# Patient Record
Sex: Male | Born: 1975 | Race: White | Hispanic: No | Marital: Single | Smoking: Current every day smoker
Health system: Southern US, Community
[De-identification: ages and names within clinical notes are randomized; demographics above are authoritative.]

## PROBLEM LIST (undated history)

## (undated) DIAGNOSIS — I10 Essential (primary) hypertension: Secondary | ICD-10-CM

## (undated) DIAGNOSIS — L509 Urticaria, unspecified: Secondary | ICD-10-CM

## (undated) HISTORY — DX: Essential (primary) hypertension: I10

## (undated) HISTORY — DX: Urticaria, unspecified: L50.9

---

## 2004-05-11 ENCOUNTER — Emergency Department (HOSPITAL_COMMUNITY): Admission: EM | Admit: 2004-05-11 | Discharge: 2004-05-11 | Payer: Self-pay | Admitting: Family Medicine

## 2005-01-05 ENCOUNTER — Emergency Department (HOSPITAL_COMMUNITY): Admission: EM | Admit: 2005-01-05 | Discharge: 2005-01-05 | Payer: Self-pay | Admitting: Family Medicine

## 2005-01-11 ENCOUNTER — Ambulatory Visit (HOSPITAL_COMMUNITY): Admission: RE | Admit: 2005-01-11 | Discharge: 2005-01-11 | Payer: Self-pay | Admitting: Emergency Medicine

## 2005-03-08 ENCOUNTER — Emergency Department (HOSPITAL_COMMUNITY): Admission: EM | Admit: 2005-03-08 | Discharge: 2005-03-08 | Payer: Self-pay | Admitting: Family Medicine

## 2006-06-25 ENCOUNTER — Emergency Department (HOSPITAL_COMMUNITY): Admission: EM | Admit: 2006-06-25 | Discharge: 2006-06-26 | Payer: Self-pay | Admitting: Emergency Medicine

## 2011-11-21 ENCOUNTER — Emergency Department (INDEPENDENT_AMBULATORY_CARE_PROVIDER_SITE_OTHER)
Admission: EM | Admit: 2011-11-21 | Discharge: 2011-11-21 | Disposition: A | Discharge status: Home / Self Care | Payer: Self-pay | Source: Home / Self Care | Attending: Family Medicine | Admitting: Family Medicine

## 2011-11-21 ENCOUNTER — Encounter (HOSPITAL_COMMUNITY): Payer: Self-pay | Admitting: Emergency Medicine

## 2011-11-21 DIAGNOSIS — H6690 Otitis media, unspecified, unspecified ear: Secondary | ICD-10-CM

## 2011-11-21 DIAGNOSIS — J329 Chronic sinusitis, unspecified: Secondary | ICD-10-CM

## 2011-11-21 MED ORDER — AMOXICILLIN 500 MG PO CAPS: 500.0000 mg | ORAL_CAPSULE | Freq: Three times a day (TID) | ORAL | Status: DC

## 2011-11-21 MED ORDER — CETIRIZINE HCL 10 MG PO CHEW: 10.0000 mg | CHEWABLE_TABLET | Freq: Every day | ORAL | Status: AC

## 2011-11-21 MED ORDER — SALINE NASAL SPRAY 0.65 % NA SOLN: 1.0000 | NASAL | Status: DC | PRN

## 2011-11-21 NOTE — ED Notes (Signed)
Pt c/o poss sinus infection since Sunday... Sx include: nasal congestion, cough w/yellow sputum, fevers, nose bleeds and also c/o left ear pain since yesterday... Denies: vomiting, nauseas, diarrhea, sore throat.

## 2011-11-21 NOTE — ED Provider Notes (Signed)
History     CSN: [id number]  Arrival date & time 11/21/11  1016   First MD Initiated Contact with Patient 11/21/11 1056      Chief Complaint  Patient presents with  . Sinusitis    (Consider location/radiation/quality/duration/timing/severity/associated sxs/prior treatment) Patient is a 36 y.o. male presenting with sinusitis. The history is provided by the patient.  Sinusitis  This is a new problem. The current episode started 2 days ago. The problem has been gradually worsening. There has been no fever. The pain is mild. The pain has been constant since onset. Associated symptoms include chills, congestion, ear pain and sinus pressure. Pertinent negatives include no sweats, no hoarse voice, no sore throat, no swollen glands, no cough and no shortness of breath. Associated symptoms comments: Nose bleeding. Treatments tried: dayquil and nyquil. The treatment provided mild relief.    History reviewed. No pertinent past medical history.  History reviewed. No pertinent past surgical history.  No family history on file.  History  Substance Use Topics  . Smoking status: Current Every Day Smoker -- 1.5 packs/day    Types: Cigarettes  . Smokeless tobacco: Not on file  . Alcohol Use: Yes      Review of Systems  Constitutional: Positive for chills.  HENT: Positive for ear pain, nosebleeds, congestion and sinus pressure. Negative for sore throat and hoarse voice.   Respiratory: Negative for cough and shortness of breath.   All other systems reviewed and are negative.    Allergies  Review of patient's allergies indicates no known allergies.  Home Medications   Current Outpatient Rx  Name  Route  Sig  Dispense  Refill  . AMOXICILLIN 500 MG PO CAPS   Oral   Take 1 capsule (500 mg total) by mouth 3 (three) times daily.   21 capsule   0   . CETIRIZINE HCL 10 MG PO CHEW   Oral   Chew 1 tablet (10 mg total) by mouth daily.   30 tablet   0   . SALINE NASAL SPRAY 0.65 % NA  SOLN   Nasal   Place 1 spray into the nose as needed for congestion.   30 mL   12     BP 147/93  Pulse 72  Temp 98.6 F (37 C) (Oral)  Resp 18  SpO2 100%  Physical Exam  Nursing note and vitals reviewed. Constitutional: He is oriented to person, place, and time. Vital signs are normal. He appears well-developed and well-nourished. He is active and cooperative.  HENT:  Head: Normocephalic.  Right Ear: External ear normal. Tympanic membrane is injected and bulging.  Left Ear: External ear normal. Tympanic membrane is erythematous and bulging.  Nose: Mucosal edema present. Right sinus exhibits maxillary sinus tenderness. Right sinus exhibits no frontal sinus tenderness. Left sinus exhibits maxillary sinus tenderness. Left sinus exhibits no frontal sinus tenderness.  Mouth/Throat: Uvula is midline and mucous membranes are normal. Posterior oropharyngeal erythema present. No posterior oropharyngeal edema.       Post nasal gtt noted  Eyes: Conjunctivae normal and EOM are normal. Pupils are equal, round, and reactive to light. No scleral icterus.  Neck: Trachea normal, normal range of motion and full passive range of motion without pain. Neck supple.  Cardiovascular: Normal rate, regular rhythm, normal heart sounds, intact distal pulses and normal pulses.   Pulmonary/Chest: Effort normal and breath sounds normal.  Lymphadenopathy:       Head (right side): Tonsillar adenopathy present. No submental, no submandibular, no  preauricular, no posterior auricular and no occipital adenopathy present.       Head (left side): Tonsillar and posterior auricular adenopathy present.  Neurological: He is alert and oriented to person, place, and time. No cranial nerve deficit or sensory deficit.  Skin: Skin is warm and dry. No rash noted.  Psychiatric: He has a normal mood and affect. His speech is normal and behavior is normal. Judgment and thought content normal. Cognition and memory are normal.    ED  Course  Procedures (including critical care time)  Labs Reviewed - No data to display No results found.   1. Sinusitis   2. Otitis media       MDM  Increase fluid intake, rest.  Begin Amoxicillin.  Begin expectorant/decongestant, topical decongestant, saline nasal spray and/or saline irrigation, and cough suppressant at bedtime. Antihistamines of your choice (Claritin or Zyrtec).  Tylenol or Motrin for fever/discomfort.  Followup with PCP if not improving 7 to 10 days.       Johnsie Kindred, NP 11/21/11 1110

## 2011-11-21 NOTE — ED Provider Notes (Signed)
Medical screening examination/treatment/procedure(s) were performed by resident physician or non-physician practitioner and as supervising physician I was immediately available for consultation/collaboration.   KINDL,JAMES DOUGLAS MD.    James D Kindl, MD 11/21/11 2135 

## 2012-03-01 ENCOUNTER — Emergency Department (HOSPITAL_COMMUNITY)
Admission: EM | Admit: 2012-03-01 | Discharge: 2012-03-01 | Disposition: A | Discharge status: Home / Self Care | Payer: Self-pay | Source: Home / Self Care | Attending: Family Medicine | Admitting: Family Medicine

## 2012-03-01 ENCOUNTER — Encounter (HOSPITAL_COMMUNITY): Payer: Self-pay

## 2012-03-01 DIAGNOSIS — J020 Streptococcal pharyngitis: Secondary | ICD-10-CM

## 2012-03-01 LAB — POCT RAPID STREP A: Streptococcus, Group A Screen (Direct): POSITIVE — AB

## 2012-03-01 MED ORDER — IBUPROFEN 800 MG PO TABS
ORAL_TABLET | ORAL | Status: AC
  Filled 2012-03-01: qty 1

## 2012-03-01 MED ORDER — IBUPROFEN 800 MG PO TABS
800.0000 mg | ORAL_TABLET | Freq: Once | ORAL | Status: AC
  Administered 2012-03-01: 800 mg via ORAL

## 2012-03-01 MED ORDER — PENICILLIN G BENZATHINE 1200000 UNIT/2ML IM SUSP
1.2000 10*6.[IU] | Freq: Once | INTRAMUSCULAR | Status: AC
  Administered 2012-03-01: 1.2 10*6.[IU] via INTRAMUSCULAR

## 2012-03-01 MED ORDER — PENICILLIN G BENZATHINE 1200000 UNIT/2ML IM SUSP
INTRAMUSCULAR | Status: AC
  Filled 2012-03-01: qty 2

## 2012-03-01 NOTE — ED Provider Notes (Signed)
History     CSN: [id number]  Arrival date & time 03/01/12  1041   First MD Initiated Contact with Patient 03/01/12 1318      Chief Complaint  Patient presents with  . Sore Throat    (Consider location/radiation/quality/duration/timing/severity/associated sxs/prior treatment) HPI Comments: Denies strep contact  Patient is a 37 y.o. male presenting with pharyngitis. The history is provided by the patient.  Sore Throat This is a new problem. The current episode started more than 2 days ago. The problem occurs constantly. The problem has not changed since onset.The symptoms are aggravated by swallowing. Nothing relieves the symptoms. He has tried nothing for the symptoms.    History reviewed. No pertinent past medical history.  History reviewed. No pertinent past surgical history.  History reviewed. No pertinent family history.  History  Substance Use Topics  . Smoking status: Current Every Day Smoker -- 1.50 packs/day    Types: Cigarettes  . Smokeless tobacco: Not on file  . Alcohol Use: Yes      Review of Systems  Constitutional: Positive for fever and chills.  HENT: Positive for sore throat. Negative for congestion and rhinorrhea.   Respiratory: Negative for cough.   Skin: Positive for rash. Negative for pallor.    Allergies  Review of patient's allergies indicates no known allergies.  Home Medications   Current Outpatient Rx  Name  Route  Sig  Dispense  Refill  . amoxicillin (AMOXIL) 500 MG capsule   Oral   Take 1 capsule (500 mg total) by mouth 3 (three) times daily.   21 capsule   0   . cetirizine (ZYRTEC) 10 MG chewable tablet   Oral   Chew 1 tablet (10 mg total) by mouth daily.   30 tablet   0   . sodium chloride (OCEAN NASAL SPRAY) 0.65 % nasal spray   Nasal   Place 1 spray into the nose as needed for congestion.   30 mL   12     BP 162/86  Pulse 122  Temp(Src) 102.7 F (39.3 C) (Oral)  Resp 16  SpO2 99%  Physical Exam   Constitutional: He appears well-developed and well-nourished. He appears ill. No distress.  HENT:  Right Ear: Tympanic membrane, external ear and ear canal normal.  Left Ear: Tympanic membrane, external ear and ear canal normal.  Mouth/Throat: Mucous membranes are normal. Oropharyngeal exudate, posterior oropharyngeal edema and posterior oropharyngeal erythema present.  Cardiovascular: Regular rhythm.  Tachycardia present.   Pulmonary/Chest: Effort normal and breath sounds normal.    ED Course  Procedures (including critical care time)  Labs Reviewed  POCT RAPID STREP A ([facility] ONLY) - Abnormal; Notable for the following:    Streptococcus, Group A Screen (Direct) POSITIVE (*)    All other components within normal limits   No results found.   1. Strep pharyngitis       MDM          Cathlyn Parsons, NP 03/01/12 1322

## 2012-03-01 NOTE — ED Notes (Signed)
3rd day of HA, body aches, ST; tonsils swollen, exudative, red

## 2012-03-01 NOTE — ED Provider Notes (Signed)
Medical screening examination/treatment/procedure(s) were performed by resident physician or non-physician practitioner and as supervising physician I was immediately available for consultation/collaboration.   KINDL,JAMES DOUGLAS MD.   James D Kindl, MD 03/01/12 1459 

## 2012-04-01 ENCOUNTER — Emergency Department (HOSPITAL_COMMUNITY)
Admission: EM | Admit: 2012-04-01 | Discharge: 2012-04-01 | Disposition: A | Discharge status: Home / Self Care | Payer: Self-pay | Source: Home / Self Care | Attending: Emergency Medicine | Admitting: Emergency Medicine

## 2012-04-01 ENCOUNTER — Emergency Department (INDEPENDENT_AMBULATORY_CARE_PROVIDER_SITE_OTHER): Payer: Self-pay

## 2012-04-01 ENCOUNTER — Encounter (HOSPITAL_COMMUNITY): Payer: Self-pay | Admitting: Emergency Medicine

## 2012-04-01 DIAGNOSIS — J40 Bronchitis, not specified as acute or chronic: Secondary | ICD-10-CM

## 2012-04-01 LAB — POCT RAPID STREP A: Streptococcus, Group A Screen (Direct): NEGATIVE

## 2012-04-01 MED ORDER — AZITHROMYCIN 250 MG PO TABS: 250.0000 mg | ORAL_TABLET | Freq: Every day | ORAL | Status: DC

## 2012-04-01 MED ORDER — ALBUTEROL SULFATE HFA 108 (90 BASE) MCG/ACT IN AERS: 1.0000 | INHALATION_SPRAY | Freq: Four times a day (QID) | RESPIRATORY_TRACT | Status: DC | PRN

## 2012-04-01 NOTE — ED Provider Notes (Signed)
History     CSN: [id number]  Arrival date & time 04/01/12  1205   None     Chief Complaint  Patient presents with  . URI  . Abdominal Pain    (Consider location/radiation/quality/duration/timing/severity/associated sxs/prior treatment) Patient is a 37 y.o. male presenting with URI and abdominal pain. The history is provided by the patient. No language interpreter was used.  URI Presenting symptoms: congestion, cough and rhinorrhea   Severity:  Moderate Onset quality:  Gradual Timing:  Constant Progression:  Worsening Chronicity:  Chronic Worsened by:  Nothing tried Ineffective treatments:  None tried Risk factors: no sick contacts   Abdominal Pain Associated symptoms: cough   Pt complains of a cough for the past 2 months.  Pt reports he has had sinus drainage,  Pt reports coughing for 2 months.   Pt reports upper abdomen sore from coughing  History reviewed. No pertinent past medical history.  History reviewed. No pertinent past surgical history.  History reviewed. No pertinent family history.  History  Substance Use Topics  . Smoking status: Current Every Day Smoker -- 1.50 packs/day    Types: Cigarettes  . Smokeless tobacco: Not on file  . Alcohol Use: Yes      Review of Systems  HENT: Positive for congestion and rhinorrhea.   Respiratory: Positive for cough.   Gastrointestinal: Positive for abdominal pain.  All other systems reviewed and are negative.    Allergies  Review of patient's allergies indicates no known allergies.  Home Medications   Current Outpatient Rx  Name  Route  Sig  Dispense  Refill  . amoxicillin (AMOXIL) 500 MG capsule   Oral   Take 1 capsule (500 mg total) by mouth 3 (three) times daily.   21 capsule   0   . cetirizine (ZYRTEC) 10 MG chewable tablet   Oral   Chew 1 tablet (10 mg total) by mouth daily.   30 tablet   0   . sodium chloride (OCEAN NASAL SPRAY) 0.65 % nasal spray   Nasal   Place 1 spray into the nose as  needed for congestion.   30 mL   12     BP 145/91  Pulse 90  Temp(Src) 98.1 F (36.7 C) (Oral)  SpO2 99%  Physical Exam  Nursing note and vitals reviewed. Constitutional: He appears well-developed and well-nourished.  HENT:  Head: Normocephalic and atraumatic.  Right Ear: External ear normal.  Left Ear: External ear normal.  Eyes: Conjunctivae are normal. Pupils are equal, round, and reactive to light.  Neck: Normal range of motion. Neck supple.  Cardiovascular: Normal rate.   Pulmonary/Chest: Effort normal. He exhibits tenderness.  Abdominal: Soft. Bowel sounds are normal.  Musculoskeletal: Normal range of motion.  Neurological: He is alert.  Skin: Skin is warm.    ED Course  Procedures (including critical care time)  Labs Reviewed  POCT RAPID STREP A ([facility] ONLY)   Dg Chest 2 View  04/01/2012  *RADIOLOGY REPORT*  Clinical Data: 37 year old male cough.  History of smoking.  CHEST - 2 VIEW  Comparison: None.  Findings: Curvilinear opacity in the right upper lobe appears represent scarring.  Lung volumes within normal limits.  No pneumothorax, pulmonary edema, pleural effusion or other confluent pulmonary opacity.  Cardiac size and mediastinal contours are within normal limits.  Visualized tracheal air column is within normal limits.  No acute osseous abnormality identified.  IMPRESSION: Evidence of right upper lobe scarring. No acute cardiopulmonary abnormality.  Original Report Authenticated By: Erskine Speed, M.D.      1. Bronchitis       MDM  Pt given rx for zithromax and albuterol.   Pt advised to stop smoking.        Lonia Skinner [place], PA-C 04/01/12 1516  Elson Areas, PA-C 04/01/12 1752  Lonia Skinner [place], PA-C 04/01/12 1901  Elson Areas, PA-C 04/02/12 [address] [place], [place] 04/02/12 1257

## 2012-04-01 NOTE — ED Notes (Addendum)
Pt c/o sore throat, stuffy nose, and productive cough with yellow/greenish phlegm x 1 month. Felt sinus pressure and also ear pain. Took Tylenol sinus and pressure has resolved. Still has a cough, reports had strept throat once already this yr. Also reports epigastric pain from emesis Friday night. Feels like he strained too hard and has sharp pains whenever his stomach growls. Patient is alert and oriented.

## 2012-04-03 NOTE — ED Provider Notes (Signed)
Medical screening examination/treatment/procedure(s) were performed by non-physician practitioner and as supervising physician I was immediately available for consultation/collaboration.   [facility]; MD  Sharin Grave, MD 04/03/12 [phone]

## 2013-11-20 ENCOUNTER — Emergency Department (HOSPITAL_COMMUNITY)
Admission: EM | Admit: 2013-11-20 | Discharge: 2013-11-20 | Disposition: A | Discharge status: Home / Self Care | Payer: No Typology Code available for payment source | Attending: Emergency Medicine | Admitting: Emergency Medicine

## 2013-11-20 ENCOUNTER — Emergency Department (HOSPITAL_COMMUNITY): Payer: No Typology Code available for payment source

## 2013-11-20 ENCOUNTER — Encounter (HOSPITAL_COMMUNITY): Payer: Self-pay

## 2013-11-20 DIAGNOSIS — Y92410 Unspecified street and highway as the place of occurrence of the external cause: Secondary | ICD-10-CM | POA: Insufficient documentation

## 2013-11-20 DIAGNOSIS — Z79899 Other long term (current) drug therapy: Secondary | ICD-10-CM | POA: Insufficient documentation

## 2013-11-20 DIAGNOSIS — Z720 Tobacco use: Secondary | ICD-10-CM | POA: Diagnosis not present

## 2013-11-20 DIAGNOSIS — S39012A Strain of muscle, fascia and tendon of lower back, initial encounter: Secondary | ICD-10-CM | POA: Diagnosis not present

## 2013-11-20 DIAGNOSIS — Z792 Long term (current) use of antibiotics: Secondary | ICD-10-CM | POA: Insufficient documentation

## 2013-11-20 DIAGNOSIS — Y9389 Activity, other specified: Secondary | ICD-10-CM | POA: Insufficient documentation

## 2013-11-20 DIAGNOSIS — Y998 Other external cause status: Secondary | ICD-10-CM | POA: Diagnosis not present

## 2013-11-20 DIAGNOSIS — S3992XA Unspecified injury of lower back, initial encounter: Secondary | ICD-10-CM | POA: Diagnosis present

## 2013-11-20 MED ORDER — HYDROCODONE-ACETAMINOPHEN 5-325 MG PO TABS: 1.0000 | ORAL_TABLET | Freq: Four times a day (QID) | ORAL | Status: DC | PRN

## 2013-11-20 MED ORDER — HYDROCODONE-ACETAMINOPHEN 5-325 MG PO TABS
1.0000 | ORAL_TABLET | Freq: Once | ORAL | Status: AC
  Administered 2013-11-20: 1 via ORAL
  Filled 2013-11-20: qty 1

## 2013-11-20 MED ORDER — CYCLOBENZAPRINE HCL 5 MG PO TABS: 5.0000 mg | ORAL_TABLET | Freq: Three times a day (TID) | ORAL | Status: DC | PRN

## 2013-11-20 NOTE — Discharge Instructions (Signed)

## 2013-11-20 NOTE — Progress Notes (Signed)
Orthopedic Tech Progress Note Patient Details:  Frederick Elliott 03/22/1975 [id number]  Patient ID: Frederick Elliott, male   DOB: 03/22/1975, 38 y.o.   MRN: [id number] Bio-tech brace order completed  Nikki DomCrawford, Maximiliano Cromartie 11/20/2013, 3:11 PM
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
## 2013-11-20 NOTE — ED Notes (Addendum)
Pt. Reports having soreness on his rt. Lateral  Rib area.  Pt. Reports that it is sore to touch,.  No visible marks noted.  Reported to Dr. Madilyn Hookees.
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
## 2020-05-12 DIAGNOSIS — E785 Hyperlipidemia, unspecified: Secondary | ICD-10-CM | POA: Diagnosis not present

## 2020-05-12 DIAGNOSIS — Z720 Tobacco use: Secondary | ICD-10-CM | POA: Diagnosis not present

## 2020-05-12 DIAGNOSIS — I10 Essential (primary) hypertension: Secondary | ICD-10-CM | POA: Diagnosis not present

## 2020-05-12 DIAGNOSIS — Z789 Other specified health status: Secondary | ICD-10-CM | POA: Diagnosis not present

## 2021-06-01 DIAGNOSIS — L500 Allergic urticaria: Secondary | ICD-10-CM | POA: Diagnosis not present

## 2021-06-01 DIAGNOSIS — I10 Essential (primary) hypertension: Secondary | ICD-10-CM | POA: Diagnosis not present

## 2021-06-01 DIAGNOSIS — L509 Urticaria, unspecified: Secondary | ICD-10-CM | POA: Diagnosis not present

## 2021-06-01 DIAGNOSIS — T7840XA Allergy, unspecified, initial encounter: Secondary | ICD-10-CM | POA: Diagnosis not present

## 2021-06-01 DIAGNOSIS — Z760 Encounter for issue of repeat prescription: Secondary | ICD-10-CM | POA: Diagnosis not present

## 2021-07-27 DIAGNOSIS — E785 Hyperlipidemia, unspecified: Secondary | ICD-10-CM | POA: Diagnosis not present

## 2021-07-27 DIAGNOSIS — Z1331 Encounter for screening for depression: Secondary | ICD-10-CM | POA: Diagnosis not present

## 2021-07-27 DIAGNOSIS — I10 Essential (primary) hypertension: Secondary | ICD-10-CM | POA: Diagnosis not present

## 2021-07-27 DIAGNOSIS — Z720 Tobacco use: Secondary | ICD-10-CM | POA: Diagnosis not present

## 2021-11-21 DIAGNOSIS — R1084 Generalized abdominal pain: Secondary | ICD-10-CM | POA: Diagnosis not present

## 2022-11-22 DIAGNOSIS — Z1211 Encounter for screening for malignant neoplasm of colon: Secondary | ICD-10-CM | POA: Diagnosis not present

## 2022-11-22 DIAGNOSIS — E785 Hyperlipidemia, unspecified: Secondary | ICD-10-CM | POA: Diagnosis not present

## 2022-11-22 DIAGNOSIS — F17210 Nicotine dependence, cigarettes, uncomplicated: Secondary | ICD-10-CM | POA: Diagnosis not present

## 2022-11-22 DIAGNOSIS — I10 Essential (primary) hypertension: Secondary | ICD-10-CM | POA: Diagnosis not present

## 2023-09-20 ENCOUNTER — Encounter: Payer: Self-pay | Admitting: Allergy and Immunology

## 2023-09-20 ENCOUNTER — Ambulatory Visit: Payer: Self-pay | Admitting: Allergy and Immunology

## 2023-09-20 VITALS — BP 130/86 | HR 96 | Temp 98.0°F | Resp 16 | Ht 71.0 in | Wt 202.8 lb

## 2023-09-20 DIAGNOSIS — T7840XD Allergy, unspecified, subsequent encounter: Secondary | ICD-10-CM

## 2023-09-20 MED ORDER — NEFFY 2 MG/0.1ML NA SOLN: 1.0000 | NASAL | 2 refills | Status: AC | PRN

## 2023-09-20 NOTE — Progress Notes (Signed)
 [place] [facility] [place] - [place] - [place]   Dear Lauraine Sic,  Thank you for referring Frederick Elliott to the [facility] [place]  on 09/20/2023.   Below is a summation of this patient's evaluation and recommendations.  Thank you for your referral. I will keep you informed about this patient's response to treatment.   If you have any questions please do not hesitate to contact me.   Sincerely,  Camellia DOROTHA Denis, MD Allergy / Immunology [name] [place]    ______________________________________________________________________    NEW PATIENT NOTE  Referring Provider: Sic Lauraine DASEN, NP Primary Provider: Sic Lauraine DASEN, NP Date of office visit: 09/20/2023    Subjective:   Chief Complaint:  Frederick Elliott (DOB: [birth year]) is a 48 y.o. male who presents to the clinic on 09/20/2023 with a chief complaint of Urticaria .     HPI: Frederick Elliott presents to this clinic in evaluation of allergic reactions.  He has had a total of 3 reactions all of them very similar except for his last 1 was unique for 1 issue.  With each episode he develops global urticaria with red raised itchy lesions across his body that last several hours and during resolution never heal with scar or hyperpigmentation.  He does not have any associated systemic or constitutional symptoms except for his last reaction.  With his last reaction he vomited.  There is not really an obvious provoking factor giving rise to this issue.  Focusing on his very last reaction which occurred about a month ago he ate a roast beef and gravy and potatoes in a crockpot around 8 PM and he had his reaction around 2 PM.  He eats meats all the time with no problem.  He cannot really remember any triggers for his 1st or 2nd reaction.  He has had a problem with tick bites and chigger.  He had a tick bite on his scrotum several years ago and he states that that area  of his scrotum really swells up bad whenever he develops an episode of hives.  He uses lisinopril for blood pressure control.  Past Medical History:  Diagnosis Date   Hypertension, essential    Urticaria     History reviewed. No pertinent surgical history.  Allergies as of 09/20/2023   No Known Allergies      Medication List    lisinopril 20 MG tablet Commonly known as: ZESTRIL Take 20 mg by mouth daily.   rosuvastatin 10 MG tablet Commonly known as: CRESTOR Take 10 mg by mouth at bedtime.    Review of systems negative except as noted in HPI / PMHx or noted below:  Review of Systems  Constitutional: Negative.   HENT: Negative.    Eyes: Negative.   Respiratory: Negative.    Cardiovascular: Negative.   Gastrointestinal: Negative.   Genitourinary: Negative.   Musculoskeletal: Negative.   Skin: Negative.   Neurological: Negative.   Endo/Heme/Allergies: Negative.   Psychiatric/Behavioral: Negative.      History reviewed. No pertinent family history.  Social History   Socioeconomic History   Marital status: Single    Spouse name: Not on file   Number of children: Not on file   Years of education: Not on file   Highest education level: Not on file  Occupational History   Not on file  Tobacco Use   Smoking status: Every Day    Current packs/day: 1.00  Types: Cigarettes   Smokeless tobacco: Not on file  Substance and Sexual Activity   Alcohol use: Yes   Drug use: No   Sexual activity: Not on file  Other Topics Concern   Not on file  Social History Narrative   Not on file    Environmental and Social history  Lives in a house with a dry environment, cat looking inside the household, carpet in the bedroom, no plastic in the bed, no plastic in the pillow, and smoking cigarettes at 1 pack/day.  He works as an Personnel officer.  Objective:   Vitals:   09/20/23 0904  BP: 130/86  Pulse: 96  Resp: 16  SpO2: 98%   Height: 5' 11 (180.3 cm) Weight: 202  lb 12.8 oz (92 kg)  Physical Exam Constitutional:      Appearance: He is not diaphoretic.  HENT:     Head: Normocephalic.     Right Ear: Tympanic membrane, ear canal and external ear normal.     Left Ear: Tympanic membrane, ear canal and external ear normal.     Nose: Nose normal. No mucosal edema or rhinorrhea.     Mouth/Throat:     Pharynx: Uvula midline. No oropharyngeal exudate.  Eyes:     Conjunctiva/sclera: Conjunctivae normal.  Neck:     Thyroid : No thyromegaly.     Trachea: Trachea normal. No tracheal tenderness or tracheal deviation.  Cardiovascular:     Rate and Rhythm: Normal rate and regular rhythm.     Heart sounds: Normal heart sounds, S1 normal and S2 normal. No murmur heard. Pulmonary:     Effort: No respiratory distress.     Breath sounds: Normal breath sounds. No stridor. No wheezing or rales.  Lymphadenopathy:     Head:     Right side of head: No tonsillar adenopathy.     Left side of head: No tonsillar adenopathy.     Cervical: No cervical adenopathy.  Skin:    Findings: No erythema or rash.     Nails: There is no clubbing.  Neurological:     Mental Status: He is alert.     Diagnostics: Allergy skin tests were not performed.   Assessment and Plan:    1. Allergic reaction, subsequent encounter    1. Blood - CBC w/d, tryptase, alpha-gal panel  2. NEFFY , benadryl, (MD/ER evaluation) for allergic reaction  3. Further evaluation???  4. Influenza = Tamiflu. Covid = Paxlovid  5. 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  We will screen him for increased mast cell burden and alpha gal as noted above.  I think would be best if he does not use a ACE inhibitor as this will make treatment of an allergic reaction much more difficult should it occur in the future.  I will contact  him with the results of his blood test once they are available for review.  Camellia DOROTHA Denis, MD Allergy / Immunology [facility] [place] 

## 2023-09-20 NOTE — Patient Instructions (Addendum)
  1. Blood - CBC w/d, tryptase, alpha-gal panel  2. NEFFY , benadryl, (MD/ER evaluation) for allergic reaction  3. Further evaluation???  4. Influenza = Tamiflu. Covid = Paxlovid  5. Have [name] replace Lisinopril with a different medication (Losartan?)

## 2023-09-23 LAB — CBC WITH DIFF/PLATELET
Basophils Absolute: 0.1 x10E3/uL (ref 0.0–0.2)
Basos: 1 %
EOS (ABSOLUTE): 0.2 x10E3/uL (ref 0.0–0.4)
Eos: 2 %
Hematocrit: 47.3 % (ref 37.5–51.0)
Hemoglobin: 15.3 g/dL (ref 13.0–17.7)
Immature Grans (Abs): 0 x10E3/uL (ref 0.0–0.1)
Immature Granulocytes: 0 %
Lymphocytes Absolute: 2.3 x10E3/uL (ref 0.7–3.1)
Lymphs: 25 %
MCH: 31.7 pg (ref 26.6–33.0)
MCHC: 32.3 g/dL (ref 31.5–35.7)
MCV: 98 fL — ABNORMAL HIGH (ref 79–97)
Monocytes Absolute: 0.7 x10E3/uL (ref 0.1–0.9)
Monocytes: 7 %
Neutrophils Absolute: 5.8 x10E3/uL (ref 1.4–7.0)
Neutrophils: 65 %
Platelets: 325 x10E3/uL (ref 150–450)
RBC: 4.83 x10E6/uL (ref 4.14–5.80)
RDW: 13.2 % (ref 11.6–15.4)
WBC: 9 x10E3/uL (ref 3.4–10.8)

## 2023-09-23 LAB — TRYPTASE: Tryptase: 9.1 ug/L (ref 2.2–13.2)

## 2023-09-23 LAB — ALPHA-GAL PANEL
Allergen Lamb IgE: 60.8 kU/L — AB
Beef IgE: 84.4 kU/L — AB
IgE (Immunoglobulin E), Serum: 362 [IU]/mL (ref 6–495)
O215-IgE Alpha-Gal: >100 kU/L — AB
Pork IgE: 24.1 kU/L — AB

## 2023-09-24 ENCOUNTER — Encounter: Payer: Self-pay | Admitting: Allergy and Immunology

## 2023-09-24 ENCOUNTER — Ambulatory Visit: Payer: Self-pay | Admitting: Allergy and Immunology
